# Patient Record
Sex: Male | Born: 1960 | Race: White | Hispanic: No | Marital: Single | Smoking: Former smoker
Health system: Southern US, Community
[De-identification: ages and names within clinical notes are randomized; demographics above are authoritative.]

## PROBLEM LIST (undated history)

## (undated) HISTORY — PX: TOTAL KNEE ARTHROPLASTY: SHX125

## (undated) HISTORY — PX: ANAL FISSURECTOMY: SUR608

## (undated) HISTORY — PX: UVULOPALATOPHARYNGOPLASTY: SHX827

---

## 2007-07-30 ENCOUNTER — Ambulatory Visit: Payer: Self-pay | Admitting: Family Medicine

## 2009-01-26 ENCOUNTER — Ambulatory Visit: Payer: Self-pay | Admitting: Family Medicine

## 2009-05-31 IMAGING — CT CT CHEST W/ CM
1 series · 15 of 31 positions shown, 19 images · non-contrast
Comparison: none

REASON FOR EXAM: left upper lobe nodule
COMMENTS:

[Series 2: soft tissue · axial · 0.84mm/px · z∈[-249,+66]mm · 15 of 69 slices shown, 19 images]
[im 3/69  mediastinal]
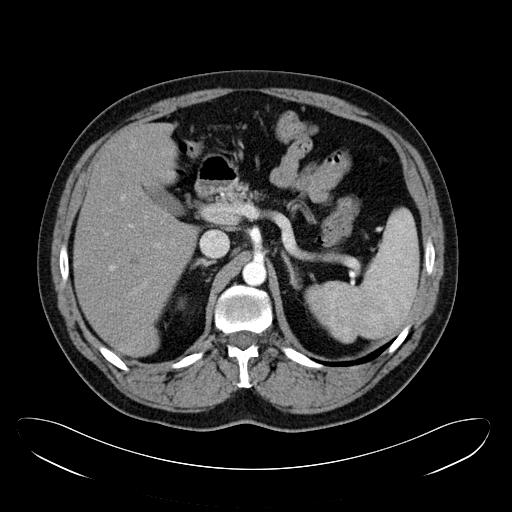
[im 3/69  lung]
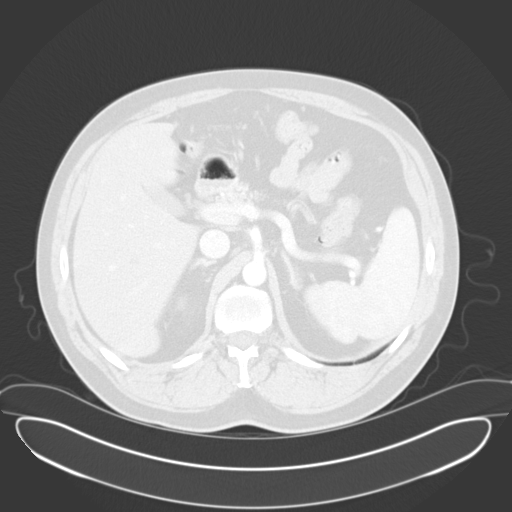
[im 8/69  lung]
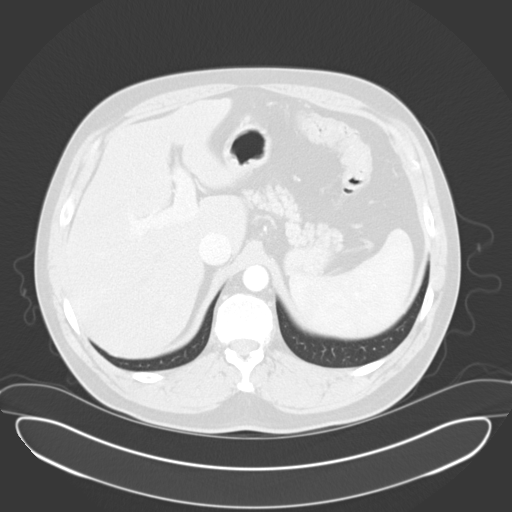
[im 13/69  lung]
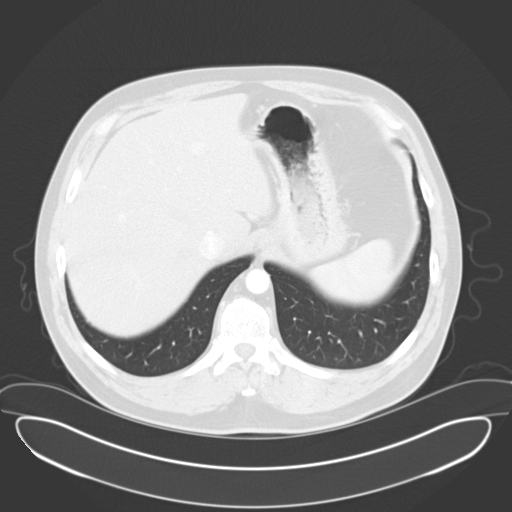
[im 16/69  lung]
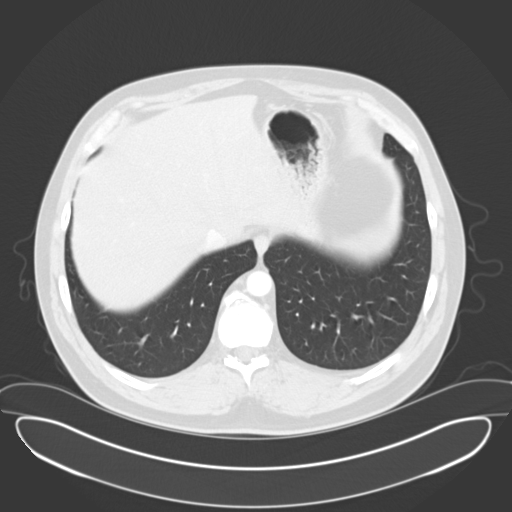
[im 21/69  mediastinal]
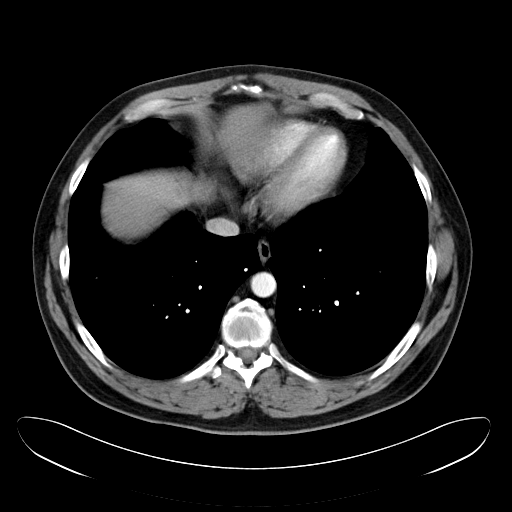
[im 21/69  lung]
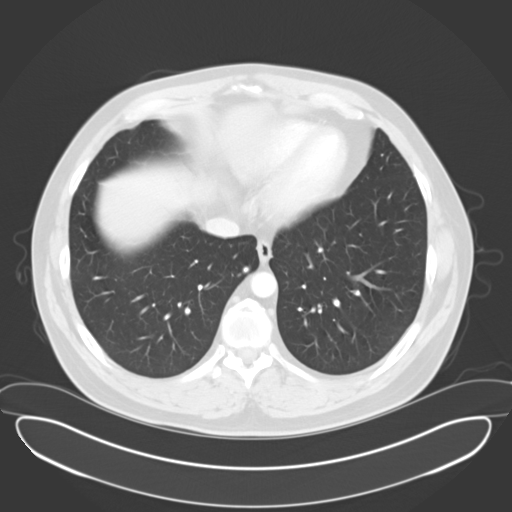
[im 26/69  lung]
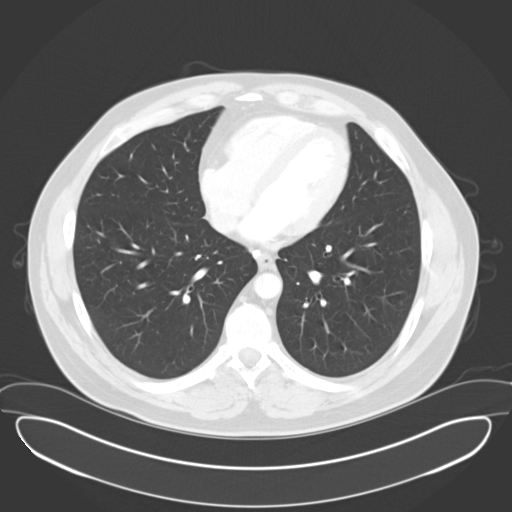
[im 31/69  lung]
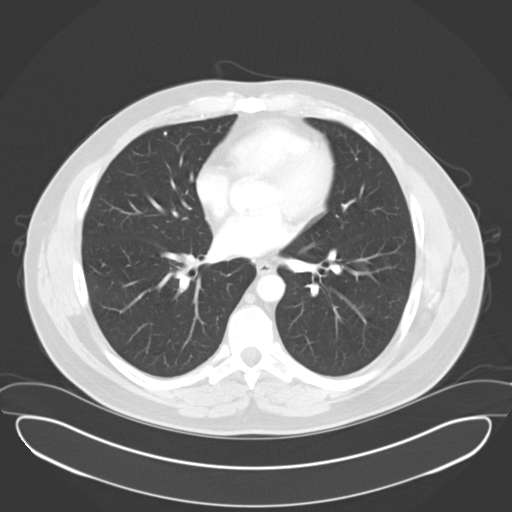
[im 36/69  lung]
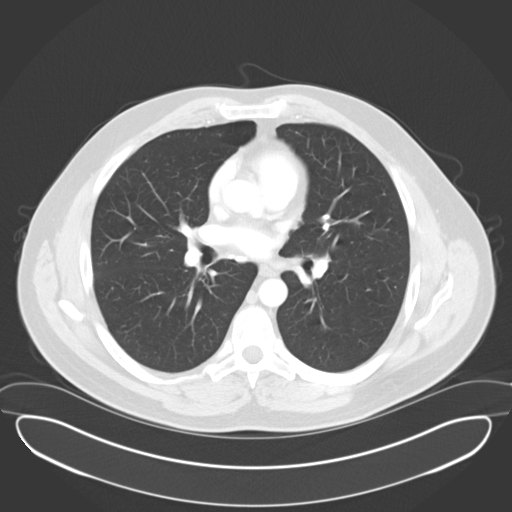
[im 38/69  mediastinal]
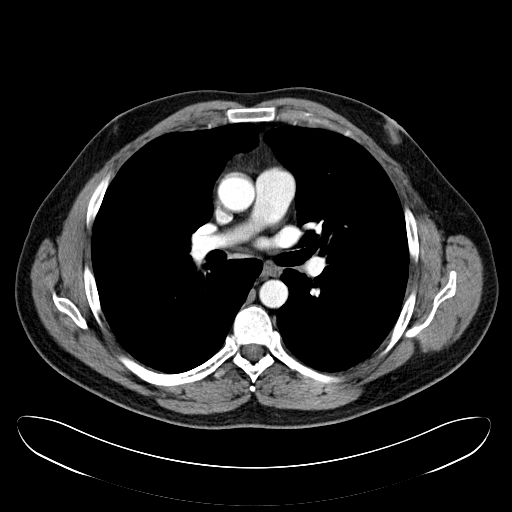
[im 38/69  lung]
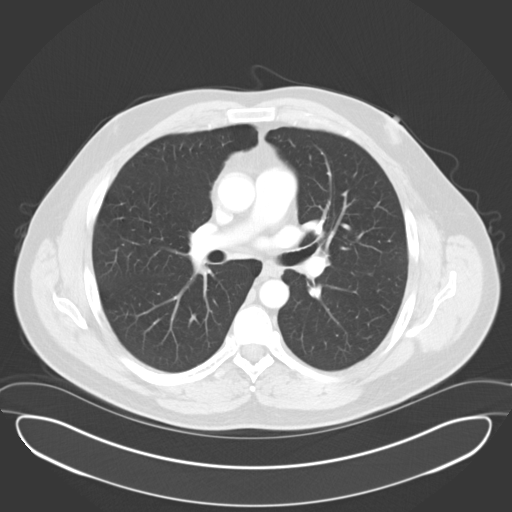
[im 43/69  lung]
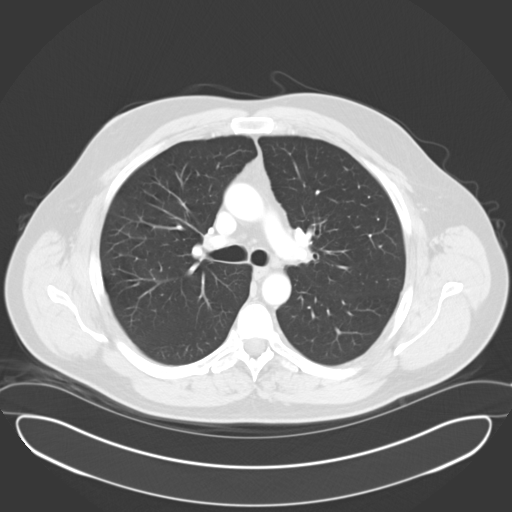
[im 48/69  lung]
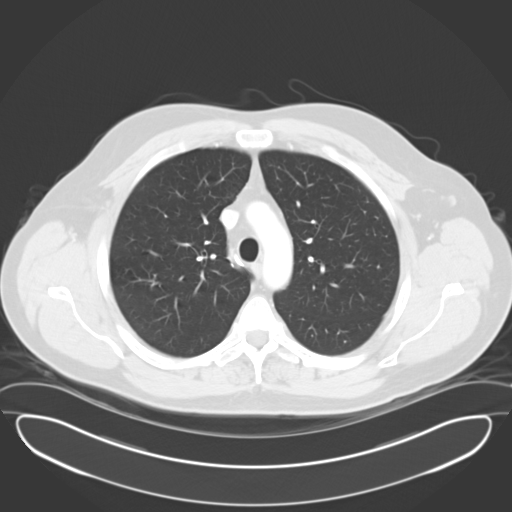
[im 53/69  lung]
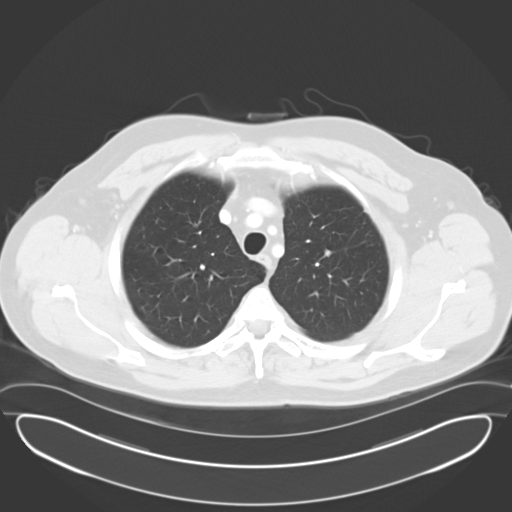
[im 56/69  mediastinal]
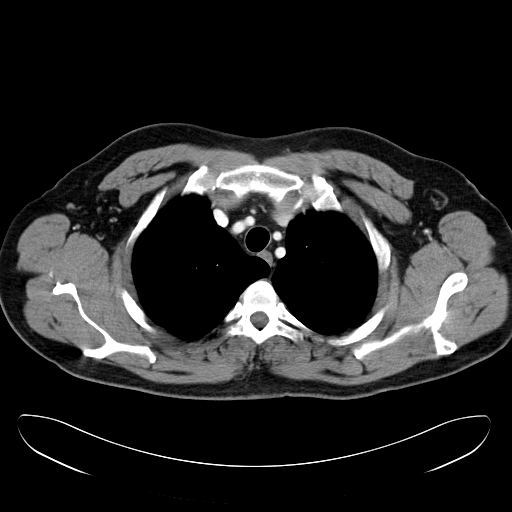
[im 56/69  lung]
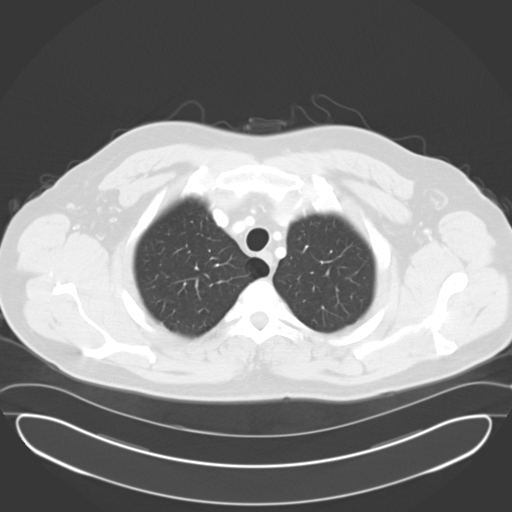
[im 61/69  lung]
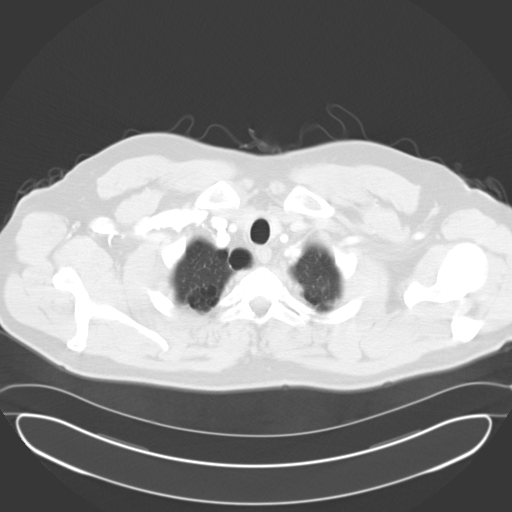
[im 66/69  lung]
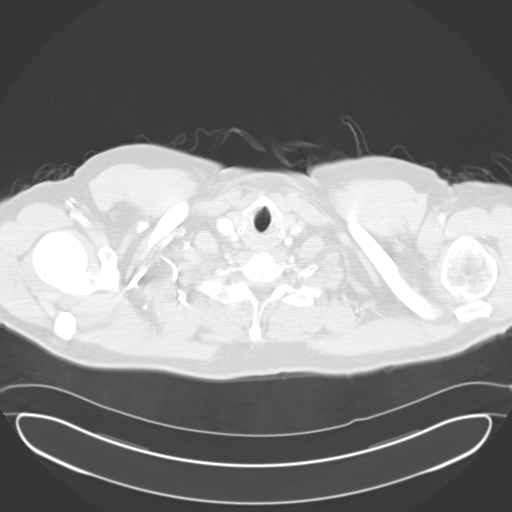

[15 of 31 positions shown; findings below may reference images not displayed]

PROCEDURE:     CT  - CT CHEST WITH CONTRAST  - July 30, 2007  [DATE]

RESULT:      Helical 5 mm sections were obtained from the thoracic inlet
through the lung bases status post intravenous administration of 70 ml of
Tsovue-CQI.  Evaluation of the mediastinum, hilar regions and structures
demonstrates no evidence of mediastinal or hilar adenopathy or masses.  A
linear area with a trace amount of soft tissue attenuation projects within
the anterior lateral LEFT upper lobe region.  This appears to be contiguous
with the pleural surface and may represent scar or atelectasis.  A more
ominous etiology cannot be completely excluded though is of lower
differential consideration.  This appears to correlate with the previously
described area on plain film radiograph from [HOSPITAL] Breast and [REDACTED] dated [DATE].
Evaluation of the visualized upper abdominal viscera demonstrates no gross
abnormalities.
IMPRESSION: Linear area of increased density, mild within the anterolateral aspect of
the RIGHT upper lobe. This has a CT appearance likely representing scar or
possibly atelectasis.  A more ominous etiology cannot be completely
excluded. Surveillance evaluation recommended if and as clinically warranted.

## 2010-11-28 IMAGING — CT CT CHEST W/ CM
1 series · 15 of 31 positions shown, 19 images · non-contrast
Comparison: none

REASON FOR EXAM: abn CXR  persistent left upper lobe  nodule
COMMENTS:

[Series 2: soft tissue · axial · 0.85mm/px · z∈[-904,-589]mm · 15 of 69 slices shown, 19 images]
[im 3/69  mediastinal]
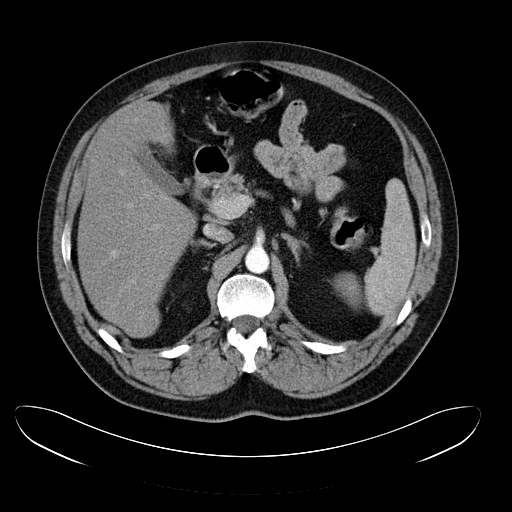
[im 3/69  lung]
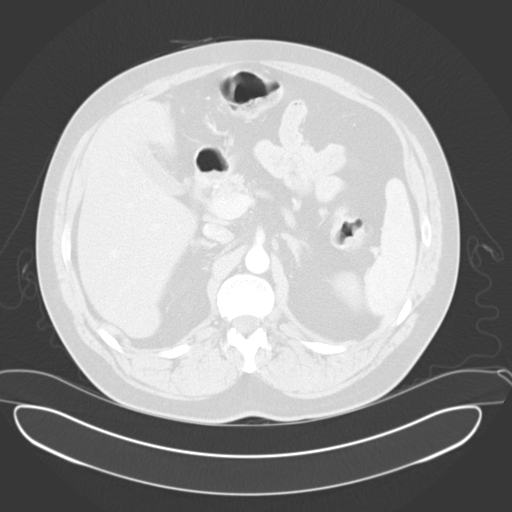
[im 8/69  lung]
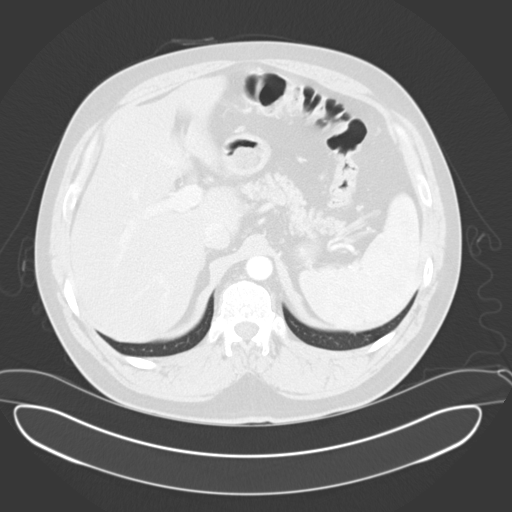
[im 13/69  lung]
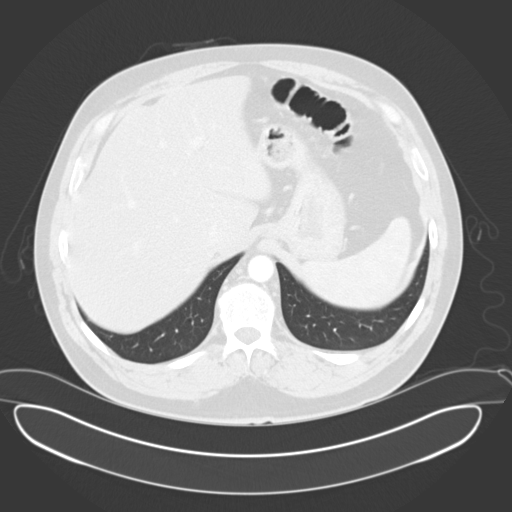
[im 16/69  lung]
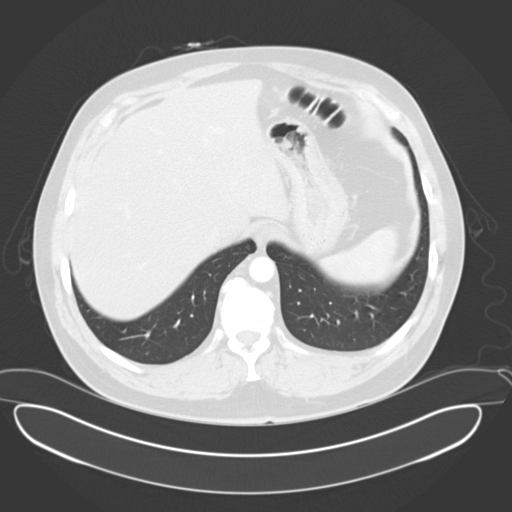
[im 21/69  mediastinal]
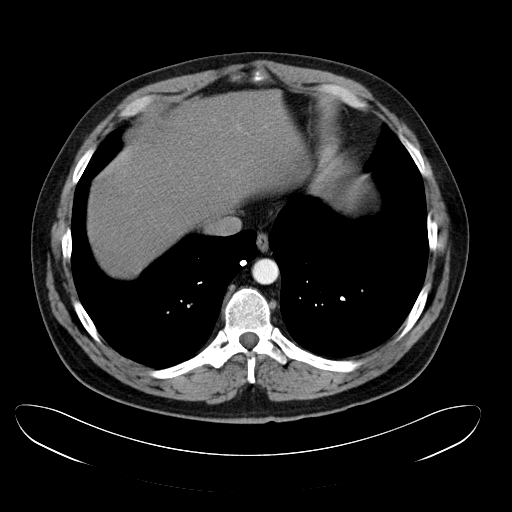
[im 21/69  lung]
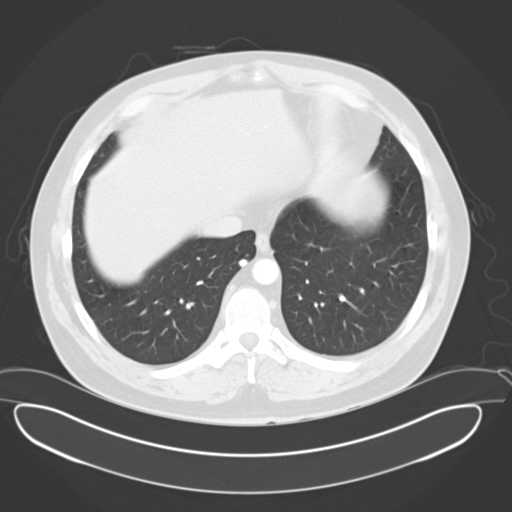
[im 26/69  lung]
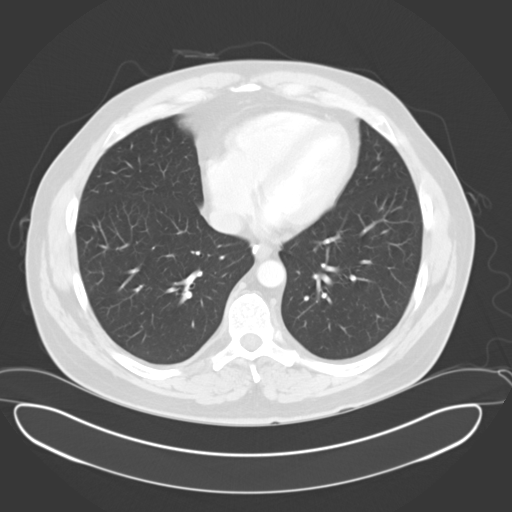
[im 31/69  lung]
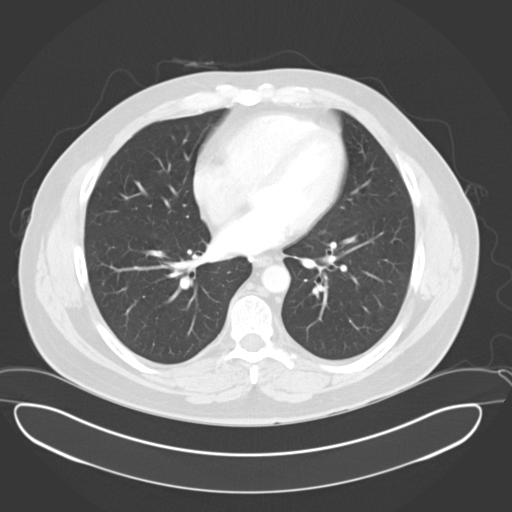
[im 36/69  lung]
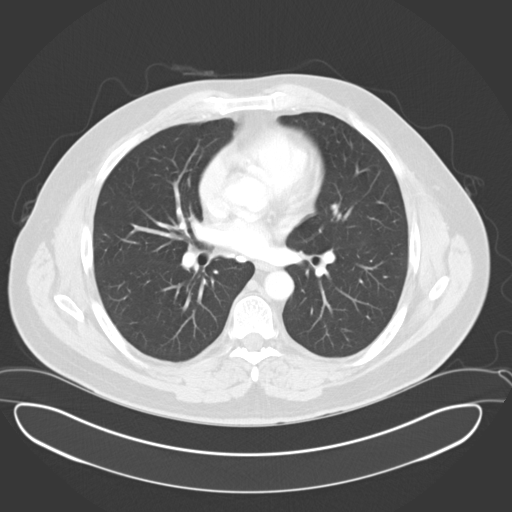
[im 38/69  mediastinal]
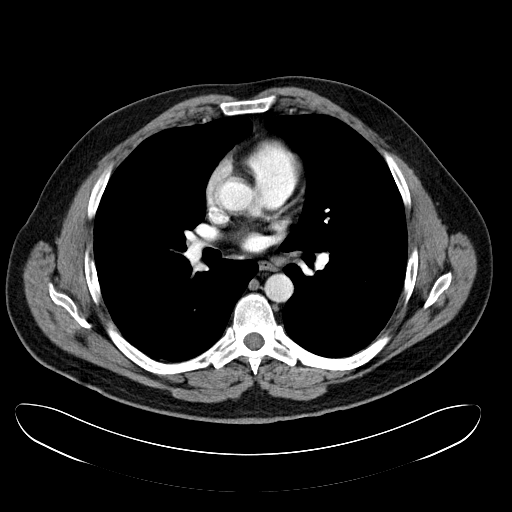
[im 38/69  lung]
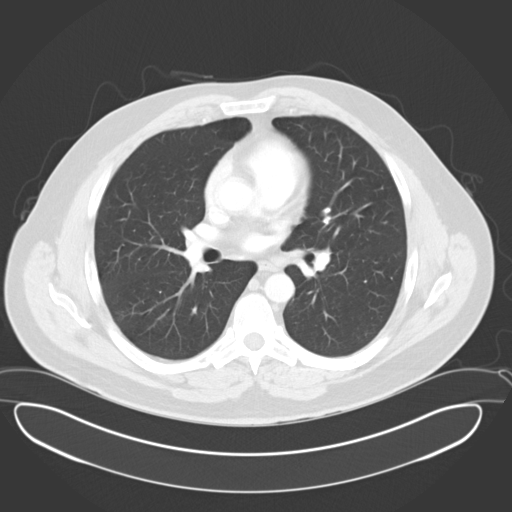
[im 43/69  lung]
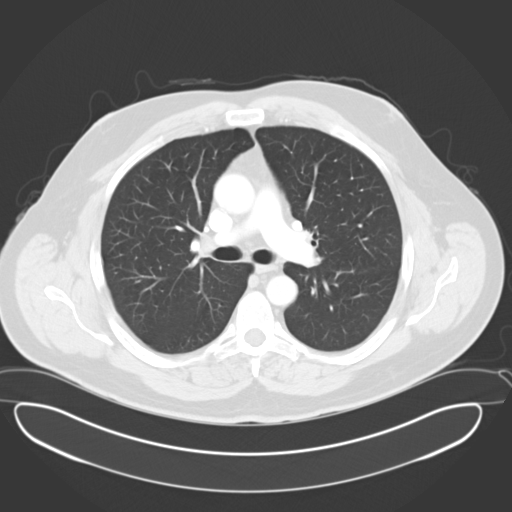
[im 48/69  lung]
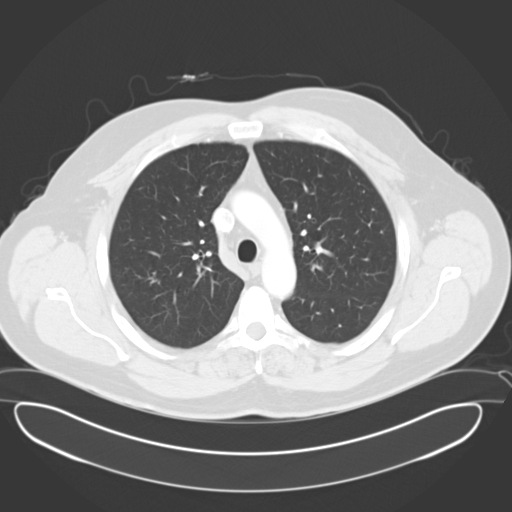
[im 53/69  lung]
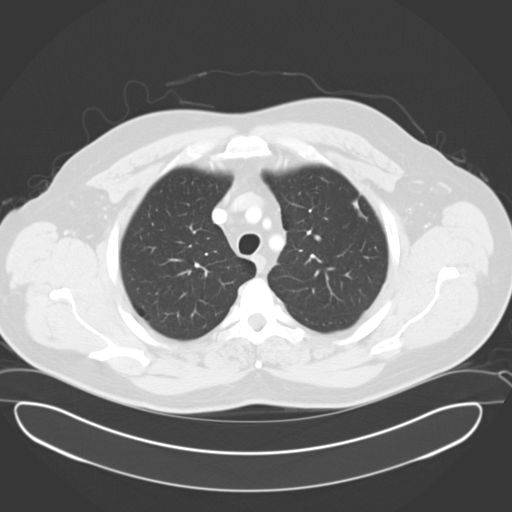
[im 56/69  mediastinal]
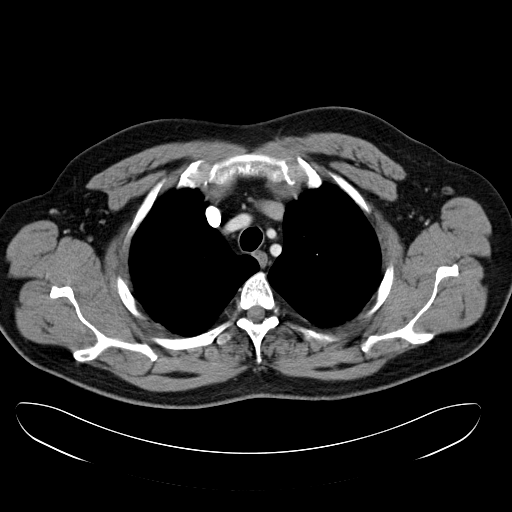
[im 56/69  lung]
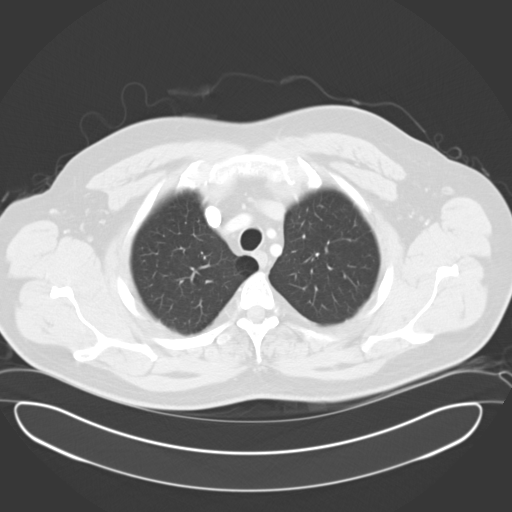
[im 61/69  lung]
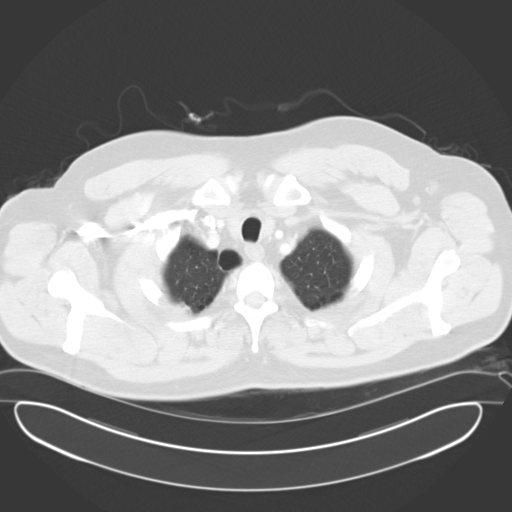
[im 66/69  lung]
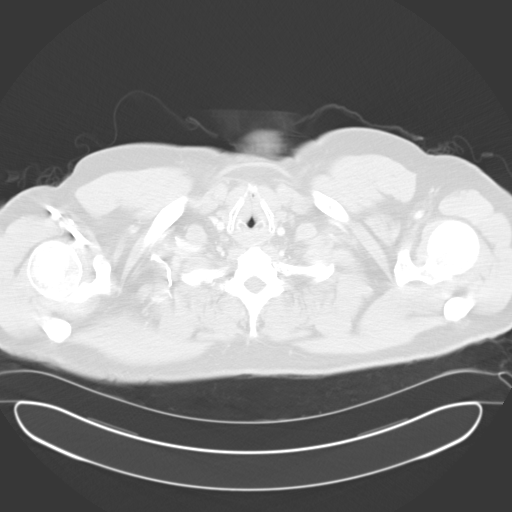

[15 of 31 positions shown; findings below may reference images not displayed]

PROCEDURE:     CT  - CT CHEST WITH CONTRAST  - January 26, 2009  [DATE]

RESULT:     CT of the chest is performed utilizing approximately 75 ml of
8sovue-6TN iodinated intravenous contrast. Comparison is made to the study
dated 07/30/2007. Images are reconstructed in the axial plane at 5 mm slice
thickness.

There is curvilinear density in the left upper lobe abutting the
anterolateral pleura as demonstrated on the previous exam. This has an
appearance consistent with fibrosis and is unchanged. No discrete pulmonary
parenchymal mass or nodule is evident otherwise. There is no endobronchial
lesion. There is no edema, infiltrate or effusion. There is no pneumothorax.
The mediastinal structures appear to be normal. There is no hilar mass or
adenopathy. The heart is not enlarged. The upper abdominal structures
including the adrenal glands appear to be within normal limits. The thoracic
aorta is unremarkable.
IMPRESSION: 1.     Stable linear density in the left upper lobe abutting the pleural
surface without a discrete mass. This has an appearance suggestive of
fibrosis. Again, this is stable over approximately 1.5 years.

## 2021-05-29 ENCOUNTER — Other Ambulatory Visit: Payer: Self-pay

## 2021-05-29 ENCOUNTER — Ambulatory Visit (INDEPENDENT_AMBULATORY_CARE_PROVIDER_SITE_OTHER): Payer: BC Managed Care – PPO | Admitting: Internal Medicine

## 2021-05-29 VITALS — BP 143/99 | HR 84 | Resp 18 | Ht >= 80 in | Wt 251.0 lb

## 2021-05-29 DIAGNOSIS — Z7189 Other specified counseling: Secondary | ICD-10-CM | POA: Diagnosis not present

## 2021-05-29 DIAGNOSIS — E1169 Type 2 diabetes mellitus with other specified complication: Secondary | ICD-10-CM | POA: Diagnosis not present

## 2021-05-29 DIAGNOSIS — G4733 Obstructive sleep apnea (adult) (pediatric): Secondary | ICD-10-CM

## 2021-05-29 DIAGNOSIS — Z9989 Dependence on other enabling machines and devices: Secondary | ICD-10-CM

## 2021-05-29 DIAGNOSIS — E785 Hyperlipidemia, unspecified: Secondary | ICD-10-CM

## 2021-05-29 DIAGNOSIS — M109 Gout, unspecified: Secondary | ICD-10-CM | POA: Insufficient documentation

## 2021-05-29 DIAGNOSIS — E119 Type 2 diabetes mellitus without complications: Secondary | ICD-10-CM | POA: Diagnosis not present

## 2021-05-29 NOTE — Progress Notes (Signed)
[facility] [address] [place], [place] [address]  Pulmonary Sleep Medicine   Office Visit Note  Patient Name: Jesus Wood DOB: 01-Feb-1961 MRN [id number]    Chief Complaint: Obstructive Sleep Apnea visit  Brief History:  [name] is seen today for initial consult to establish care. The patient has a 12 year history of sleep apnea. Patient is using PAP nightly@ 18 cmH2O  The patient feels bloated after sleeping with PAP.  The patient reports being bother because therapy is not auto like previous therapy, but CPAP @ 18cmH2O from PAP use. Epworth Sleepiness Score is 0 out of 24. The patient does not take naps. The patient complains of the following: but having difficulty swallowing air on new machine he received a month ago.   The compliance download shows  compliance with an average use time of 7:42  hours @ 96%. The AHI is 1.7  The patient does complain of limb movements disrupting sleep.  ROS  General: (-) fever, (-) chills, (-) night sweat Nose and Sinuses: (-) nasal stuffiness or itchiness, (-) postnasal drip, (-) nosebleeds, (-) sinus trouble. Mouth and Throat: (-) sore throat, (-) hoarseness. Neck: (-) swollen glands, (-) enlarged thyroid, (-) neck pain. Respiratory: - cough, - shortness of breath, - wheezing. Neurologic: - numbness, - tingling. Psychiatric: - anxiety, - depression   Current Medication: Outpatient Encounter Medications as of 05/29/2021  Medication Sig   allopurinol (ZYLOPRIM) 300 MG tablet Take 300 mg by mouth daily.   atorvastatin (LIPITOR) 40 MG tablet Take 40 mg by mouth daily.   cyclobenzaprine (FLEXERIL) 10 MG tablet Take 10 mg by mouth 3 (three) times daily.   gemfibrozil (LOPID) 600 MG tablet Take 600 mg by mouth 2 (two) times daily.   HUMALOG KWIKPEN 100 UNIT/ML KwikPen Inject into the skin.   JANUVIA 100 MG tablet Take 100 mg by mouth daily.   LANTUS SOLOSTAR 100 UNIT/ML Solostar Pen Inject into the skin.   lidocaine (LIDODERM) 5 %  SMARTSIG:Topical   omeprazole (PRILOSEC) 20 MG capsule Take 20 mg by mouth daily.   SYNTHROID 150 MCG tablet Take by mouth.   SYNTHROID 175 MCG tablet Take by mouth.   tadalafil (CIALIS) 5 MG tablet Take 5 mg by mouth daily.   No facility-administered encounter medications on file as of 05/29/2021.    Surgical History: Past Surgical History:  Procedure Laterality Date   ANAL FISSURECTOMY     UVULOPALATOPHARYNGOPLASTY      Medical History: No past medical history on file.  Family History: Non contributory to the present illness  Social History: Social History   Socioeconomic History   Marital status: Single    Spouse name: Not on file   Number of children: Not on file   Years of education: Not on file   Highest education level: Not on file  Occupational History   Not on file  Tobacco Use   Smoking status: Former    Types: Cigarettes   Smokeless tobacco: Former  Substance and Sexual Activity   Alcohol use: Not on file   Drug use: Not on file   Sexual activity: Not on file  Other Topics Concern   Not on file  Social History Narrative   Not on file   Social Determinants of Health   Financial Resource Strain: Not on file  Food Insecurity: Not on file  Transportation Needs: Not on file  Physical Activity: Not on file  Stress: Not on file  Social Connections: Not on file  Intimate Partner Violence: Not on file    Vital Signs: Blood pressure (!) 143/99, pulse 84, resp. rate 18, height 6\' 8"  (2.032 m), weight 251 lb (113.9 kg), SpO2 97 %.  Examination: General Appearance: The patient is well-developed, well-nourished, and in no distress. Neck Circumference: 43 cm Skin: Gross inspection of skin unremarkable. Head: normocephalic, no gross deformities. Eyes: no gross deformities noted. ENT: ears appear grossly normal Neurologic: Alert and oriented. No involuntary movements.    EPWORTH SLEEPINESS SCALE:  Scale:  (0)= no chance of dozing; (1)= slight chance  of dozing; (2)= moderate chance of dozing; (3)= high chance of dozing  Chance  Situtation    Sitting and reading: 0    Watching TV: 0    Sitting Inactive in public: 0    As a passenger in car: 0      Lying down to rest: 0    Sitting and talking: 0    Sitting quielty after lunch: 0    In a car, stopped in traffic: 0   TOTAL SCORE:   0 out of 24    SLEEP STUDIES:  Split 01/03/2009 - AHI 41.3,  supine AHI 104.5,  low SpO2 89%,  CPAP @ 11cmH2O   CPAP COMPLIANCE DATA:  Date Range: 05/04/21 - 05/28/21  Average Daily Use: 7:42 hours  Median Use: 7:36  Compliance for > 4 Hours: 96% days  AHI: 1.7 respiratory events per hour  Days Used: 25/25   Mask Leak: 38.5  95th Percentile Pressure: 178 cmH2O         LABS: No results found for this or any previous visit (from the past 2160 hour(s)).  Radiology: CT Chest W Contrast  Result Date: 01/26/2009 * PRIOR REPORT IMPORTED FROM AN EXTERNAL SYSTEM * PRIOR REPORT IMPORTED FROM THE SYNGO WORKFLOW SYSTEM REASON FOR EXAM:    abn CXR  persistent left upper lobe  nodule COMMENTS: PROCEDURE:     CT  - CT CHEST WITH CONTRAST  - Jan 26 2009  9:23AM RESULT:     CT of the chest is performed utilizing approximately 75 ml of Isovue-370 iodinated intravenous contrast. Comparison is made to the study dated 07/30/2007. Images are reconstructed in the axial plane at 5 mm slice thickness. There is curvilinear density in the left upper lobe abutting the anterolateral pleura as demonstrated on the previous exam. This has an appearance consistent with fibrosis and is unchanged. No discrete pulmonary parenchymal mass or nodule is evident otherwise. There is no endobronchial lesion. There is no edema, infiltrate or effusion. There is no pneumothorax. The mediastinal structures appear to be normal. There is no hilar mass or adenopathy. The heart is not enlarged. The upper abdominal structures including the adrenal glands appear to be within normal  limits. The thoracic aorta is unremarkable. IMPRESSION: 1.     Stable linear density in the left upper lobe abutting the pleural surface without a discrete mass. This has an appearance suggestive of fibrosis. Again, this is stable over approximately 1.5 years. Thank you for the opportunity to contribute to the care of your patient.     No results found.  No results found.    Assessment and Plan: Patient Active Problem List   Diagnosis Date Noted   Diabetes mellitus without complication (HCC) 05/29/2021   Hyperlipidemia associated with type 2 diabetes mellitus (HCC) 05/29/2021   Gout 05/29/2021   OSA on CPAP 05/29/2021   CPAP use counseling 05/29/2021   1. Diabetes mellitus without complication (HCC) Continue  with insulin and f/u as ascheduled  2. Hyperlipidemia associated with type 2 diabetes mellitus (HCC) Continue with statin and gemfibrozil  3. OSA on CPAP The patient does not  tolerate PAP and reports no benefit from PAP use. This is only since he received the replacement machine, with the new settings. He was previously on autopap. We will adjust him to 6-18 cm H20 with an EPR of 2.  He will have a tech appt this week to download the old machine. We will also plan to download the new machine with the adjustment in settings in 2 weeks. IF no improvement and pt still having issues will plan a bipap titration. The patient was reminded how to clean equipment and advised to replace supplies routinely. The patient was also counselled on weight loss. The compliance is excellent. The AHI is 1.7.   OSA- continue excellent compliance- change pressure to autoadjusting with range 6-18 cm, download 2 weeks, f/u appt 57m   4. CPAP use counseling CPAP Counseling: had a lengthy discussion with the patient regarding the importance of PAP therapy in management of the sleep apnea. Patient appears to understand the risk factor reduction and also understands the risks associated with untreated sleep  apnea. Patient will try to make a good faith effort to remain compliant with therapy. Also instructed the patient on proper cleaning of the device including the water must be changed daily if possible and use of distilled water is preferred. Patient understands that the machine should be regularly cleaned with appropriate recommended cleaning solutions that do not damage the PAP machine for example given white vinegar and water rinses. Other methods such as ozone treatment may not be as good as these simple methods to achieve cleaning.     The patient does not  tolerate PAP and reports no benefit from PAP use. This is only since he received the replacement machine, with the new settings. He was previously on autopap. We will adjust him to 6-18 cm H20 with an EPR of 2.  He will have a tech appt this week to download the old machine. We will also plan to download the new machine with the adjustment in settings in 2 weeks. IF no improvement and pt still having issues will plan a bipap titration. The patient was reminded how to clean equipment and advised to replace supplies routinely. The patient was also counselled on weight loss. The compliance is excellent. The AHI is 1.7.   OSA- continue excellent compliance- change pressure to autoadjusting with range 6-18 cm, download 2 weeks, f/u appt 36m  General Counseling: I have discussed the findings of the evaluation and examination with Craig.  I have also discussed any further diagnostic evaluation thatmay be needed or ordered today. Shalev verbalizes understanding of the findings of todays visit. We also reviewed his medications today and discussed drug interactions and side effects including but not limited excessive drowsiness and altered mental states. We also discussed that there is always a risk not just to him but also people around him. he has been encouraged to call the office with any questions or concerns that should arise related to todays visit.  No  orders of the defined types were placed in this encounter.       I have personally obtained a history, examined the patient, evaluated laboratory and imaging results, formulated the assessment and plan and placed orders.   Valentino Hue Sol Blazing, PhD, FAASM  Diplomate, American Board of Sleep Medicine    [place]  Richardson Dopp, MD [facility] Diplomate ABMS Pulmonary and Critical Care Medicine Sleep medicine

## 2021-05-29 NOTE — Patient Instructions (Signed)

## 2021-11-06 ENCOUNTER — Ambulatory Visit (INDEPENDENT_AMBULATORY_CARE_PROVIDER_SITE_OTHER): Payer: BC Managed Care – PPO | Admitting: Internal Medicine

## 2021-11-06 VITALS — HR 90 | Resp 16 | Ht 72.0 in | Wt 251.0 lb

## 2021-11-06 DIAGNOSIS — G2581 Restless legs syndrome: Secondary | ICD-10-CM

## 2021-11-06 DIAGNOSIS — G4733 Obstructive sleep apnea (adult) (pediatric): Secondary | ICD-10-CM

## 2021-11-06 DIAGNOSIS — Z9989 Dependence on other enabling machines and devices: Secondary | ICD-10-CM

## 2021-11-06 DIAGNOSIS — E669 Obesity, unspecified: Secondary | ICD-10-CM

## 2021-11-06 DIAGNOSIS — Z7189 Other specified counseling: Secondary | ICD-10-CM

## 2021-11-06 NOTE — Progress Notes (Signed)
[facility] [address] [place], [place] [address]  Pulmonary Sleep Medicine   Office Visit Note  Patient Name: Jesus Wood DOB: 03-09-1961 MRN [id number]    Chief Complaint: Obstructive Sleep Apnea visit  Brief History:  Jesus Wood is seen today for follow up visit. The patient has a 12 year history of sleep apnea. Patient is using PAP nightly.  The patient feels better after sleeping with PAP.  The patient reports benefiting from PAP use. Reported sleepiness is  better and the Epworth Sleepiness Score is 1 out of 24. The patient does not take naps has occasionally mild daytime sleepiness only if he had an unusually painful night, but still never naps./. The patient complains of the following: nothing  The compliance download shows  compliance with an average use time of 7:23  hours . The AHI is 4.3  The patient does not complain of limb movements disrupting sleep.  ROS  General: (-) fever, (-) chills, (-) night sweat Nose and Sinuses: (-) nasal stuffiness or itchiness, (-) postnasal drip, (-) nosebleeds, (-) sinus trouble. Mouth and Throat: (-) sore throat, (-) hoarseness. Neck: (-) swollen glands, (-) enlarged thyroid, (-) neck pain. Respiratory: - cough, - shortness of breath, - wheezing. Neurologic: - numbness, - tingling. Psychiatric: - anxiety, - depression   Current Medication: Outpatient Encounter Medications as of 11/06/2021  Medication Sig   allopurinol (ZYLOPRIM) 300 MG tablet Take 300 mg by mouth daily.   atorvastatin (LIPITOR) 40 MG tablet Take 40 mg by mouth daily.   cyclobenzaprine (FLEXERIL) 10 MG tablet Take 10 mg by mouth 3 (three) times daily.   gemfibrozil (LOPID) 600 MG tablet Take 600 mg by mouth 2 (two) times daily.   HUMALOG KWIKPEN 100 UNIT/ML KwikPen Inject into the skin.   JANUVIA 100 MG tablet Take 100 mg by mouth daily.   LANTUS SOLOSTAR 100 UNIT/ML Solostar Pen Inject into the skin.   omeprazole (PRILOSEC) 20 MG capsule Take 20 mg  by mouth daily.   Semaglutide (OZEMPIC, 0.25 OR 0.5 MG/DOSE, [place]) Inject 0.5 mg into the skin.   SYNTHROID 150 MCG tablet Take by mouth.   SYNTHROID 175 MCG tablet Take by mouth.   tadalafil (CIALIS) 5 MG tablet Take 5 mg by mouth daily.   [DISCONTINUED] lidocaine (LIDODERM) 5 % SMARTSIG:Topical   No facility-administered encounter medications on file as of 11/06/2021.    Surgical History: Past Surgical History:  Procedure Laterality Date   ANAL FISSURECTOMY     UVULOPALATOPHARYNGOPLASTY      Medical History: History reviewed. No pertinent past medical history.  Family History: Non contributory to the present illness  Social History: Social History   Socioeconomic History   Marital status: Single    Spouse name: Not on file   Number of children: Not on file   Years of education: Not on file   Highest education level: Not on file  Occupational History   Not on file  Tobacco Use   Smoking status: Former    Types: Cigarettes   Smokeless tobacco: Former  Substance and Sexual Activity   Alcohol use: Not on file   Drug use: Not on file   Sexual activity: Not on file  Other Topics Concern   Not on file  Social History Narrative   Not on file   Social Determinants of Health   Financial Resource Strain: Not on file  Food Insecurity: Not on file  Transportation Needs: Not on file  Physical Activity: Not on file  Stress: Not on file  Social Connections: Not on file  Intimate Partner Violence: Not on file    Vital Signs: Pulse 90, resp. rate 16, height 6' (1.829 m), weight 251 lb (113.9 kg), SpO2 97 %. Body mass index is 34.04 kg/m.    Examination: General Appearance: The patient is well-developed, well-nourished, and in no distress. Neck Circumference: 48.5 cm Skin: Gross inspection of skin unremarkable. Head: normocephalic, no gross deformities. Eyes: no gross deformities noted. ENT: ears appear grossly normal Neurologic: Alert and oriented. No involuntary  movements.    EPWORTH SLEEPINESS SCALE:  Scale:  (0)= no chance of dozing; (1)= slight chance of dozing; (2)= moderate chance of dozing; (3)= high chance of dozing  Chance  Situtation    Sitting and reading: 1    Watching TV: 0    Sitting Inactive in public: 0    As a passenger in car: 0      Lying down to rest: 0    Sitting and talking: 0    Sitting quielty after lunch: 0    In a car, stopped in traffic: 0   TOTAL SCORE:   1 out of 24    SLEEP STUDIES:  Split 01/03/2009 - AHI 41.3,  supine AHI 104.5,  low SpO2 89%,  CPAP @ 11cmH2O   CPAP COMPLIANCE DATA:  Date Range: 08/08/21 - 11/05/21  Average Daily Use: 7:23 hours  Median Use: 8 jours  Compliance for > 4 Hours: 91% days  AHI: 4.3 respiratory events per hour  Days Used: 84/90  Mask Leak: 20.2 lpm  95th Percentile Pressure: 13.8 cmH2O   LABS: No results found for this or any previous visit (from the past 2160 hour(s)).  Radiology: CT Chest W Contrast  Result Date: 01/26/2009 * PRIOR REPORT IMPORTED FROM AN EXTERNAL SYSTEM * PRIOR REPORT IMPORTED FROM THE SYNGO WORKFLOW SYSTEM REASON FOR EXAM:    abn CXR  persistent left upper lobe  nodule COMMENTS: PROCEDURE:     CT  - CT CHEST WITH CONTRAST  - Jan 26 2009  9:23AM RESULT:     CT of the chest is performed utilizing approximately 75 ml of Isovue-370 iodinated intravenous contrast. Comparison is made to the study dated 07/30/2007. Images are reconstructed in the axial plane at 5 mm slice thickness. There is curvilinear density in the left upper lobe abutting the anterolateral pleura as demonstrated on the previous exam. This has an appearance consistent with fibrosis and is unchanged. No discrete pulmonary parenchymal mass or nodule is evident otherwise. There is no endobronchial lesion. There is no edema, infiltrate or effusion. There is no pneumothorax. The mediastinal structures appear to be normal. There is no hilar mass or adenopathy. The heart is not  enlarged. The upper abdominal structures including the adrenal glands appear to be within normal limits. The thoracic aorta is unremarkable. IMPRESSION: 1.     Stable linear density in the left upper lobe abutting the pleural surface without a discrete mass. This has an appearance suggestive of fibrosis. Again, this is stable over approximately 1.5 years. Thank you for the opportunity to contribute to the care of your patient.     No results found.  No results found.    Assessment and Plan: Patient Active Problem List   Diagnosis Date Noted   Obesity (BMI 30-39.9) 11/06/2021   Restless legs syndrome (RLS) 11/06/2021   Diabetes mellitus without complication ([place]) [id number]   Hyperlipidemia associated with type 2 diabetes mellitus ([place]) 05/29/2021   Gout  05/29/2021   OSA on CPAP 05/29/2021   CPAP use counseling 05/29/2021  1. OSA on CPAP The patient does tolerate PAP and reports  benefit from PAP use. The patient was reminded how to clean equipment  and advised to replace supplies routinely. The patient was also counselled on weight loss. The compliance is very good. The AHI is 4.3.   OSA- continue with very good compliance with pap. F/u one year.    2. CPAP use counseling CPAP Counseling: had a lengthy discussion with the patient regarding the importance of PAP therapy in management of the sleep apnea. Patient appears to understand the risk factor reduction and also understands the risks associated with untreated sleep apnea. Patient will try to make a good faith effort to remain compliant with therapy. Also instructed the patient on proper cleaning of the device including the water must be changed daily if possible and use of distilled water is preferred. Patient understands that the machine should be regularly cleaned with appropriate recommended cleaning solutions that do not damage the PAP machine for example given white vinegar and water rinses. Other methods such as ozone treatment  may not be as good as these simple methods to achieve cleaning.   3. Obesity (BMI 30-39.9) Obesity Counseling: Had a lengthy discussion regarding patients BMI and weight issues. Patient was instructed on portion control as well as increased activity. Also discussed caloric restrictions with trying to maintain intake less than 2000 Kcal. Discussions were made in accordance with the 5As of weight management. Simple actions such as not eating late and if able to, taking a walk is suggested.   4. Restless legs syndrome (RLS) He will get iron and ferritin testing done by his pcp (his request) and let me know of the results. He has taken gabapentin in the past, his doctor took him off of it, but he is not clear on the reason. We will see if his iron is replete and proceed accordingly.      General Counseling: I have discussed the findings of the evaluation and examination with Jesus Wood.  I have also discussed any further diagnostic evaluation thatmay be needed or ordered today. Jesus Wood verbalizes understanding of the findings of todays visit. We also reviewed his medications today and discussed drug interactions and side effects including but not limited excessive drowsiness and altered mental states. We also discussed that there is always a risk not just to him but also people around him. he has been encouraged to call the office with any questions or concerns that should arise related to todays visit.  No orders of the defined types were placed in this encounter.       I have personally obtained a history, examined the patient, evaluated laboratory and imaging results, formulated the assessment and plan and placed orders. This patient was seen today by Tressie Ellis, PA-C in collaboration with Dr. Devona Konig.   Allyne Gee, MD [facility] Diplomate ABMS Pulmonary Critical Care Medicine and Sleep Medicine

## 2021-11-06 NOTE — Patient Instructions (Signed)

## 2022-05-11 ENCOUNTER — Other Ambulatory Visit: Payer: Self-pay | Admitting: Orthopedic Surgery

## 2022-05-11 DIAGNOSIS — M25562 Pain in left knee: Secondary | ICD-10-CM

## 2022-06-04 ENCOUNTER — Ambulatory Visit
Admission: RE | Admit: 2022-06-04 | Discharge: 2022-06-04 | Disposition: A | Discharge status: Home / Self Care | Payer: BC Managed Care – PPO | Source: Ambulatory Visit | Attending: Orthopedic Surgery | Admitting: Orthopedic Surgery

## 2022-06-04 DIAGNOSIS — M25562 Pain in left knee: Secondary | ICD-10-CM

## 2022-10-11 ENCOUNTER — Other Ambulatory Visit (HOSPITAL_COMMUNITY): Payer: Self-pay | Admitting: Orthopedic Surgery

## 2022-10-11 ENCOUNTER — Ambulatory Visit (HOSPITAL_COMMUNITY)
Admission: RE | Admit: 2022-10-11 | Discharge: 2022-10-11 | Disposition: A | Discharge status: Home / Self Care | Payer: BC Managed Care – PPO | Source: Ambulatory Visit | Attending: Orthopedic Surgery | Admitting: Orthopedic Surgery

## 2022-10-11 DIAGNOSIS — Z96652 Presence of left artificial knee joint: Secondary | ICD-10-CM | POA: Diagnosis not present

## 2022-11-05 ENCOUNTER — Ambulatory Visit (INDEPENDENT_AMBULATORY_CARE_PROVIDER_SITE_OTHER): Payer: BC Managed Care – PPO | Admitting: Internal Medicine

## 2022-11-05 VITALS — BP 119/83 | HR 101 | Resp 16 | Ht 72.0 in | Wt 220.0 lb

## 2022-11-05 DIAGNOSIS — E669 Obesity, unspecified: Secondary | ICD-10-CM | POA: Diagnosis not present

## 2022-11-05 DIAGNOSIS — G4733 Obstructive sleep apnea (adult) (pediatric): Secondary | ICD-10-CM

## 2022-11-05 DIAGNOSIS — G2581 Restless legs syndrome: Secondary | ICD-10-CM | POA: Diagnosis not present

## 2022-11-05 DIAGNOSIS — Z7189 Other specified counseling: Secondary | ICD-10-CM | POA: Diagnosis not present

## 2022-11-05 NOTE — Progress Notes (Signed)
[facility] [address] [place], [place] [address]  Pulmonary Sleep Medicine   Office Visit Note  Patient Name: Jesus Wood DOB: 16-Dec-1960 MRN [id number]    Chief Complaint: Obstructive Sleep Apnea visit  Brief History:  Jesus Wood is seen today for an annual follow up on APAP 6-18 cmh20. The patient has a 12 year history of sleep apnea. Patient is using PAP nightly.  The patient feels rested after sleeping with PAP.  The patient reports benefiting from PAP use. Reported sleepiness is  improved and the Epworth Sleepiness Score is 1 out of 24. The patient does take naps. The patient complains of the following: In the past month he had a knee replacement and has not been able to sleep due to the pain.  He hasn't had any issues with the PAP therapy.  The compliance download shows 87% compliance with an average use time of 7 hours. The AHI is 3.8.  The patient does not complain of limb movements disrupting sleep.  ROS  General: (-) fever, (-) chills, (-) night sweat Nose and Sinuses: (-) nasal stuffiness or itchiness, (-) postnasal drip, (-) nosebleeds, (-) sinus trouble. Mouth and Throat: (-) sore throat, (-) hoarseness. Neck: (-) swollen glands, (-) enlarged thyroid, (-) neck pain. Respiratory: - cough, - shortness of breath, - wheezing. Neurologic: - numbness, - tingling. Psychiatric: - anxiety, - depression   Current Medication: Outpatient Encounter Medications as of 11/05/2022  Medication Sig   LORazepam (ATIVAN) 1 MG tablet Take by mouth.   olmesartan (BENICAR) 20 MG tablet Take 1 tablet by mouth daily.   tiZANidine (ZANAFLEX) 2 MG tablet Take 2 mg by mouth.   traMADol (ULTRAM) 50 MG tablet Take 50 mg by mouth every 6 (six) hours as needed.   allopurinol (ZYLOPRIM) 300 MG tablet Take 300 mg by mouth daily.   atorvastatin (LIPITOR) 40 MG tablet Take 40 mg by mouth daily.   cyclobenzaprine (FLEXERIL) 10 MG tablet Take 10 mg by mouth 3 (three) times daily.    gemfibrozil (LOPID) 600 MG tablet Take 600 mg by mouth 2 (two) times daily.   HUMALOG KWIKPEN 100 UNIT/ML KwikPen Inject into the skin.   JANUVIA 100 MG tablet Take 100 mg by mouth daily.   LANTUS SOLOSTAR 100 UNIT/ML Solostar Pen Inject into the skin.   omeprazole (PRILOSEC) 20 MG capsule Take 20 mg by mouth daily.   pregabalin (LYRICA) 50 MG capsule Take 50 mg by mouth 3 (three) times daily.   Semaglutide (OZEMPIC, 0.25 OR 0.5 MG/DOSE, [place]) Inject 0.5 mg into the skin.   SYNTHROID 150 MCG tablet Take by mouth.   SYNTHROID 175 MCG tablet Take by mouth.   tadalafil (CIALIS) 5 MG tablet Take 5 mg by mouth daily.   No facility-administered encounter medications on file as of 11/05/2022.    Surgical History: Past Surgical History:  Procedure Laterality Date   ANAL FISSURECTOMY     TOTAL KNEE ARTHROPLASTY     UVULOPALATOPHARYNGOPLASTY      Medical History: History reviewed. No pertinent past medical history.  Family History: Non contributory to the present illness  Social History: Social History   Socioeconomic History   Marital status: Single    Spouse name: Not on file   Number of children: Not on file   Years of education: Not on file   Highest education level: Not on file  Occupational History   Not on file  Tobacco Use   Smoking status: Former    Types:  Cigarettes   Smokeless tobacco: Former  Substance and Sexual Activity   Alcohol use: Not on file   Drug use: Not on file   Sexual activity: Not on file  Other Topics Concern   Not on file  Social History Narrative   Not on file   Social Determinants of Health   Financial Resource Strain: Not on file  Food Insecurity: Not on file  Transportation Needs: Not on file  Physical Activity: Not on file  Stress: Not on file  Social Connections: Not on file  Intimate Partner Violence: Not on file    Vital Signs: Blood pressure 119/83, pulse (!) 101, resp. rate 16, height 6' (1.829 m), weight 220 lb (99.8 kg), SpO2  98 %. Body mass index is 29.84 kg/m.    Examination: General Appearance: The patient is well-developed, well-nourished, and in no distress. Neck Circumference: 47 cm Skin: Gross inspection of skin unremarkable. Head: normocephalic, no gross deformities. Eyes: no gross deformities noted. ENT: ears appear grossly normal Neurologic: Alert and oriented. No involuntary movements.  STOP BANG RISK ASSESSMENT S (snore) Have you been told that you snore?     No   T (tired) Are you often tired, fatigued, or sleepy during the day?   NO  O (obstruction) Do you stop breathing, choke, or gasp during sleep? NO   P (pressure) Do you have or are you being treated for high blood pressure? YES   B (BMI) Is your body index greater than 35 kg/m? NO   A (age) Are you 81 years old or older? YES   N (neck) Do you have a neck circumference greater than 16 inches?   YES   G (gender) Are you a male? YES   TOTAL STOP/BANG "YES" ANSWERS 4       A STOP-Bang score of 2 or less is considered low risk, and a score of 5 or more is high risk for having either moderate or severe OSA. For people who score 3 or 4, doctors may need to perform further assessment to determine how likely they are to have OSA.         EPWORTH SLEEPINESS SCALE:  Scale:  (0)= no chance of dozing; (1)= slight chance of dozing; (2)= moderate chance of dozing; (3)= high chance of dozing  Chance  Situtation    Sitting and reading: 0    Watching TV: 1    Sitting Inactive in public: 0    As a passenger in car: 0      Lying down to rest: 0    Sitting and talking: 0    Sitting quielty after lunch: 0    In a car, stopped in traffic: 0   TOTAL SCORE:   1 out of 24    SLEEP STUDIES:  SPLIT (01/03/09)  AHI 41.3, supine AHI 104.5, min SPO2 89%, CPAP at 11 cmh20   CPAP COMPLIANCE DATA:  Date Range: 11/05/21 - 11/04/22  Average Daily Use: 7 hours 5 minutes  Median Use: 8 hours 1 minute  Compliance for > 4 Hours:  316 days  AHI: 3.8 respiratory events per hour  Days Used: 340/365  Mask Leak: 33.1  95th Percentile Pressure: 13.3 cmh20         LABS: No results found for this or any previous visit (from the past 2160 hour(s)).  Radiology: VAS [place] LOWER EXTREMITY VENOUS (DVT)  Result Date: 10/11/2022  Lower Venous DVT Study Patient Name:  Jesus Wood  Date of  Exam:   10/11/2022 Medical Rec #: [id number]       Accession #:    [id number] Date of Birth: 19-Nov-1960      Patient Gender: M Patient Age:   73 years Exam Location:  [facility] Procedure:      VAS [place] LOWER EXTREMITY VENOUS (DVT) Referring Phys: Frederik Pear --------------------------------------------------------------------------------  Indications: Edema, Pain, and Post-op.  Risk Factors: Left knee replacement 10/02/2022. Performing Technologist: Alvia Grove RVT  Examination Guidelines: A complete evaluation includes B-mode imaging, spectral Doppler, color Doppler, and power Doppler as needed of all accessible portions of each vessel. Bilateral testing is considered an integral part of a complete examination. Limited examinations for reoccurring indications may be performed as noted. The reflux portion of the exam is performed with the patient in reverse Trendelenburg.  +-----+---------------+---------+-----------+----------+--------------+ RIGHTCompressibilityPhasicitySpontaneityPropertiesThrombus Aging +-----+---------------+---------+-----------+----------+--------------+ CFV  Full           Yes      Yes                                 +-----+---------------+---------+-----------+----------+--------------+   +---------+---------------+---------+-----------+----------+--------------+ LEFT     CompressibilityPhasicitySpontaneityPropertiesThrombus Aging +---------+---------------+---------+-----------+----------+--------------+ CFV      Full           Yes      Yes                                  +---------+---------------+---------+-----------+----------+--------------+ SFJ      Full           Yes      Yes                                 +---------+---------------+---------+-----------+----------+--------------+ FV Prox  Full           Yes      Yes                                 +---------+---------------+---------+-----------+----------+--------------+ FV Mid   Full           Yes      Yes                                 +---------+---------------+---------+-----------+----------+--------------+ FV DistalFull           Yes      Yes                                 +---------+---------------+---------+-----------+----------+--------------+ PFV      Full           Yes      Yes                                 +---------+---------------+---------+-----------+----------+--------------+ POP      Full           Yes      Yes                                 +---------+---------------+---------+-----------+----------+--------------+ PTV  Full           Yes      Yes                                 +---------+---------------+---------+-----------+----------+--------------+ PERO     Full           Yes      Yes                                 +---------+---------------+---------+-----------+----------+--------------+ Gastroc  Full           Yes      Yes                                 +---------+---------------+---------+-----------+----------+--------------+ GSV      Full           Yes      Yes                                 +---------+---------------+---------+-----------+----------+--------------+   Findings reported to [facility]) at 1.37.  Summary: LEFT: - There is no evidence of deep vein thrombosis in the lower extremity. - There is no evidence of superficial venous thrombosis. - 2.3 x 2.07 cm fluid collection posterior knee..  *See table(s) above for measurements and observations. Electronically signed by Deitra Mayo MD  on 10/11/2022 at 3:06:45 PM.    Final     No results found.  VAS [place] LOWER EXTREMITY VENOUS (DVT)  Result Date: 10/11/2022  Lower Venous DVT Study Patient Name:  Jesus Wood  Date of Exam:   10/11/2022 Medical Rec #: [id number]       Accession #:    [id number] Date of Birth: 1961-04-01      Patient Gender: M Patient Age:   40 years Exam Location:  [facility] Procedure:      VAS [place] LOWER EXTREMITY VENOUS (DVT) Referring Phys: Frederik Pear --------------------------------------------------------------------------------  Indications: Edema, Pain, and Post-op.  Risk Factors: Left knee replacement 10/02/2022. Performing Technologist: Alvia Grove RVT  Examination Guidelines: A complete evaluation includes B-mode imaging, spectral Doppler, color Doppler, and power Doppler as needed of all accessible portions of each vessel. Bilateral testing is considered an integral part of a complete examination. Limited examinations for reoccurring indications may be performed as noted. The reflux portion of the exam is performed with the patient in reverse Trendelenburg.  +-----+---------------+---------+-----------+----------+--------------+ RIGHTCompressibilityPhasicitySpontaneityPropertiesThrombus Aging +-----+---------------+---------+-----------+----------+--------------+ CFV  Full           Yes      Yes                                 +-----+---------------+---------+-----------+----------+--------------+   +---------+---------------+---------+-----------+----------+--------------+ LEFT     CompressibilityPhasicitySpontaneityPropertiesThrombus Aging +---------+---------------+---------+-----------+----------+--------------+ CFV      Full           Yes      Yes                                 +---------+---------------+---------+-----------+----------+--------------+ SFJ      Full  Yes      Yes                                  +---------+---------------+---------+-----------+----------+--------------+ FV Prox  Full           Yes      Yes                                 +---------+---------------+---------+-----------+----------+--------------+ FV Mid   Full           Yes      Yes                                 +---------+---------------+---------+-----------+----------+--------------+ FV DistalFull           Yes      Yes                                 +---------+---------------+---------+-----------+----------+--------------+ PFV      Full           Yes      Yes                                 +---------+---------------+---------+-----------+----------+--------------+ POP      Full           Yes      Yes                                 +---------+---------------+---------+-----------+----------+--------------+ PTV      Full           Yes      Yes                                 +---------+---------------+---------+-----------+----------+--------------+ PERO     Full           Yes      Yes                                 +---------+---------------+---------+-----------+----------+--------------+ Gastroc  Full           Yes      Yes                                 +---------+---------------+---------+-----------+----------+--------------+ GSV      Full           Yes      Yes                                 +---------+---------------+---------+-----------+----------+--------------+   Findings reported to [facility]) at 1.37.  Summary: LEFT: - There is no evidence of deep vein thrombosis in the lower extremity. - There is no evidence of superficial venous thrombosis. - 2.3 x 2.07 cm fluid collection posterior knee..  *See table(s) above for measurements and observations. Electronically signed by Deitra Mayo MD on 10/11/2022 at  3:06:45 PM.    Final       Assessment and Plan: Patient Active Problem List   Diagnosis Date Noted   Obesity (BMI 30-39.9)  11/06/2021   Restless legs syndrome (RLS) 11/06/2021   Diabetes mellitus without complication ([place]) [id number]   Hyperlipidemia associated with type 2 diabetes mellitus ([place]) 05/29/2021   Gout 05/29/2021   OSA on CPAP 05/29/2021   CPAP use counseling 05/29/2021   1. OSA on CPAP The patient does tolerate PAP and reports  benefit from PAP use. The patient was reminded how to clean equipment and advised to replace supplies routinely. The patient was also counselled on weight loss. The compliance is very good . The AHI is 3.8.   OSA on cpap- continue with good compliance. Apnea is controlled. F/u one year.    2. CPAP use counseling CPAP Counseling: had a lengthy discussion with the patient regarding the importance of PAP therapy in management of the sleep apnea. Patient appears to understand the risk factor reduction and also understands the risks associated with untreated sleep apnea. Patient will try to make a good faith effort to remain compliant with therapy. Also instructed the patient on proper cleaning of the device including the water must be changed daily if possible and use of distilled water is preferred. Patient understands that the machine should be regularly cleaned with appropriate recommended cleaning solutions that do not damage the PAP machine for example given white vinegar and water rinses. Other methods such as ozone treatment may not be as good as these simple methods to achieve cleaning.   3. Obesity (BMI 30-39.9) Obesity Counseling: Had a lengthy discussion regarding patients BMI and weight issues. Patient was instructed on portion control as well as increased activity. Also discussed caloric restrictions with trying to maintain intake less than 2000 Kcal. Discussions were made in accordance with the 5As of weight management. Simple actions such as not eating late and if able to, taking a walk is suggested.   4. Restless legs syndrome (RLS) Controlled with pregabalin.  Continue.     General Counseling: I have discussed the findings of the evaluation and examination with Jesus Wood.  I have also discussed any further diagnostic evaluation thatmay be needed or ordered today. Jesus Wood verbalizes understanding of the findings of todays visit. We also reviewed his medications today and discussed drug interactions and side effects including but not limited excessive drowsiness and altered mental states. We also discussed that there is always a risk not just to him but also people around him. he has been encouraged to call the office with any questions or concerns that should arise related to todays visit.  No orders of the defined types were placed in this encounter.       I have personally obtained a history, examined the patient, evaluated laboratory and imaging results, formulated the assessment and plan and placed orders. This patient was seen today by Tressie Ellis, PA-C in collaboration with Dr. Devona Konig.   Allyne Gee, MD [facility] Diplomate ABMS Pulmonary Critical Care Medicine and Sleep Medicine

## 2022-11-05 NOTE — Patient Instructions (Signed)
